# Patient Record
Sex: Male | Born: 1940 | Race: Black or African American | Hispanic: No | Marital: Married | Smoking: Former smoker
Health system: Southern US, Community
[De-identification: ages and names within clinical notes are randomized; demographics above are authoritative.]

## PROBLEM LIST (undated history)

## (undated) DIAGNOSIS — I10 Essential (primary) hypertension: Secondary | ICD-10-CM

## (undated) DIAGNOSIS — E785 Hyperlipidemia, unspecified: Secondary | ICD-10-CM

## (undated) DIAGNOSIS — D869 Sarcoidosis, unspecified: Secondary | ICD-10-CM

## (undated) DIAGNOSIS — Z9289 Personal history of other medical treatment: Secondary | ICD-10-CM

## (undated) DIAGNOSIS — N400 Enlarged prostate without lower urinary tract symptoms: Secondary | ICD-10-CM

## (undated) DIAGNOSIS — J309 Allergic rhinitis, unspecified: Secondary | ICD-10-CM

## (undated) DIAGNOSIS — G4733 Obstructive sleep apnea (adult) (pediatric): Secondary | ICD-10-CM

## (undated) HISTORY — DX: Obstructive sleep apnea (adult) (pediatric): G47.33

## (undated) HISTORY — DX: Sarcoidosis, unspecified: D86.9

## (undated) HISTORY — DX: Allergic rhinitis, unspecified: J30.9

## (undated) HISTORY — DX: Essential (primary) hypertension: I10

## (undated) HISTORY — DX: Personal history of other medical treatment: Z92.89

## (undated) HISTORY — DX: Hyperlipidemia, unspecified: E78.5

## (undated) HISTORY — DX: Benign prostatic hyperplasia without lower urinary tract symptoms: N40.0

---

## 2015-02-15 ENCOUNTER — Ambulatory Visit (INDEPENDENT_AMBULATORY_CARE_PROVIDER_SITE_OTHER): Payer: Medicare Other | Admitting: Critical Care Medicine

## 2015-02-15 ENCOUNTER — Encounter: Payer: Self-pay | Admitting: *Deleted

## 2015-02-15 VITALS — BP 152/80 | HR 60 | Temp 96.3°F | Ht 68.0 in | Wt 185.4 lb

## 2015-02-15 DIAGNOSIS — I10 Essential (primary) hypertension: Secondary | ICD-10-CM

## 2015-02-15 DIAGNOSIS — N400 Enlarged prostate without lower urinary tract symptoms: Secondary | ICD-10-CM

## 2015-02-15 DIAGNOSIS — Z9289 Personal history of other medical treatment: Secondary | ICD-10-CM | POA: Insufficient documentation

## 2015-02-15 DIAGNOSIS — G4733 Obstructive sleep apnea (adult) (pediatric): Secondary | ICD-10-CM

## 2015-02-15 DIAGNOSIS — E785 Hyperlipidemia, unspecified: Secondary | ICD-10-CM

## 2015-02-15 DIAGNOSIS — J309 Allergic rhinitis, unspecified: Secondary | ICD-10-CM

## 2015-02-15 DIAGNOSIS — R0600 Dyspnea, unspecified: Secondary | ICD-10-CM | POA: Insufficient documentation

## 2015-02-15 DIAGNOSIS — D869 Sarcoidosis, unspecified: Secondary | ICD-10-CM

## 2015-02-15 NOTE — Patient Instructions (Signed)
Labs today: ACE level We will [facility] to [facility] will be obtained  An overnight sleep oxygen test on Room air on cpap will be obtained No medication changes for now  Return 6 weeks

## 2015-02-15 NOTE — Assessment & Plan Note (Signed)
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

## 2015-02-15 NOTE — Assessment & Plan Note (Signed)
Dyspnea on exertion. No desat with exertion. No physical exam findings c/w active sarcoidosis. [facility] 2014 NAD. Need updated imaging  Plan CT Chest Get pft results  ACE level

## 2015-02-15 NOTE — Progress Notes (Signed)
Subjective:    Patient ID: Alex Roberts, male    DOB: Feb 12, 1941, 74 y.o.   MRN: [id number]  HPI Comments: Hx of sarcoidosis since 1970.  Just in lungs and also rash in upper arms. Pt had mediastinoscopy and skin bx and pos for granulomas: [facility]. Pt Rx :  No steroids Rx and had cleared on its own. Pt noted off and on no real issues, except some mild dyspnea.  Now more dyspnea with exertion not at rest, not on oxygen . Dx osa and on cpap qhs , full face mask.  [employer] is [employer] [place]- 1979  [place] Underground, 7890ft from [place], pumps equip with high nose.   Shortness of Breath This is a chronic problem. The current episode started more than 1 year ago. The problem occurs daily (more DOE x last few years, ok at rest. Exercise is a challenge, after riding bike or yard work , no qhs dyspnea ). The problem has been gradually worsening. Associated symptoms include leg pain and wheezing. Pertinent negatives include no abdominal pain, chest pain, fever, headaches, hemoptysis, leg swelling, neck pain, orthopnea, PND, rash, rhinorrhea, sore throat, sputum production, swollen glands, syncope or vomiting. The symptoms are aggravated by fumes, odors, pollens, URIs, occupational exposure, exercise, any activity and emotional upset. Associated symptoms comments: No real cough Hearing loss, hx of nuclear weapons, svc connected but denied claim More joint pain . Risk factors include smoking (smoked 2659yrs and quit in 1993). He has tried nothing for the symptoms. His past medical history is significant for chronic lung disease. There is no history of allergies, aspirin allergies, asthma, bronchiolitis, CAD, COPD, DVT, a heart failure, PE or pneumonia.    Past Medical History  Diagnosis Date  . Hyperlipemia   . OSA (obstructive sleep apnea)   . Benign essential hypertension   . 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 Allergic  rhinitis   . Sarcoidosis   . History of positive PPD      Family History  Problem Relation Age of Onset  . Prostate cancer Brother   . Bone cancer Father   . Arthritis Sister   . Heart attack Brother     died of MI at age 73     History   Social History  . Marital Status: Married    Spouse Name: N/A  . Number of Children: N/A  . Years of Education: N/A   Occupational History  . Not on file.   Social History Main Topics  . Smoking status: Former Smoker -- 0.50 packs/day for 30 years    Types: Cigarettes    Quit date: 02/15/1992  . Smokeless tobacco: Never Used  . Alcohol Use: 0.6 oz/week    1 Standard drinks or equivalent per week  . Drug Use: Not on file  . Sexual Activity: Not on file   Other Topics Concern  . Not on file   Social History Narrative   Married. 3 Children.   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   Retired from [employer].     No Known Allergies   No outpatient prescriptions prior to visit.   No facility-administered medications prior to visit.      Review of Systems  Constitutional: Negative for fever and fatigue.  HENT: Positive for trouble swallowing. Negative for rhinorrhea, sore throat and voice change.  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 Negative for cough, hemoptysis, sputum production, chest tightness and stridor.   Cardiovascular: Negative for chest pain, orthopnea, leg swelling, syncope and PND.  Gastrointestinal: Negative for vomiting and abdominal pain.       GERD  Musculoskeletal: Negative for neck pain.  Skin: Negative for rash.  Neurological: Negative for headaches.       Objective:   Physical Exam Filed Vitals:   02/15/15 0958  BP: 152/80  Pulse: 60  Temp: 96.3 F (35.7 C)  TempSrc: Oral  Height: 5\' 8"  (1.727 m)  SpO2: 97%    Gen: Pleasant, well-nourished,  in no distress,  normal affect  ENT: No lesions,  mouth clear,  oropharynx clear, no postnasal drip  Neck: No JVD, no TMG, no carotid bruits  Lungs: No use of accessory muscles, no dullness to percussion, clear without rales or rhonchi  Cardiovascular: RRR, heart sounds normal, no murmur or gallops, no peripheral edema  Abdomen: soft and NT, no HSM,  BS normal  Musculoskeletal: No deformities, no cyanosis or clubbing  Neuro: alert, non focal  Skin: Warm, no lesions or rashes  No results found.  2013 CXR: NAD [facility]: requested CMET, CBC, Ser Calcium and LFTs 2015: NORMAL Amb sat on RA 02/15/2015: 3Laps HR to 86 Sats down to 90%        Assessment & Plan:  Dyspnea Dyspnea on exertion. No desat with exertion. No physical exam findings c/w active sarcoidosis. [facility] CX[facility] 2014 NAD. 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

## 2015-02-22 ENCOUNTER — Institutional Professional Consult (permissible substitution): Payer: Self-pay | Admitting: Critical Care Medicine

## 2015-02-28 ENCOUNTER — Telehealth: Payer: Self-pay | Admitting: Critical Care Medicine

## 2015-02-28 DIAGNOSIS — D869 Sarcoidosis, unspecified: Secondary | ICD-10-CM

## 2015-02-28 NOTE — Telephone Encounter (Signed)
Let pt know CT Chest was normal and labs were normal NO ACTIVE sarcoidosis seen No change in medications

## 2015-02-28 NOTE — Telephone Encounter (Signed)
Spoke with pt, he is aware of results and recs.  Nothing further needed. 

## 2015-02-28 NOTE — Telephone Encounter (Signed)
Pt returning call.Stanley A Dalton ° °

## 2015-02-28 NOTE — Telephone Encounter (Signed)
lmomtcb for pt 

## 2015-03-04 ENCOUNTER — Telehealth: Payer: Self-pay | Admitting: Critical Care Medicine

## 2015-03-04 DIAGNOSIS — G4733 Obstructive sleep apnea (adult) (pediatric): Secondary | ICD-10-CM

## 2015-03-04 NOTE — Telephone Encounter (Signed)
Called, spoke with pt.  Discussed ONO results and recs per Dr. Delford FieldWright.  He verbalized understanding and voiced no further questions or concerns at this time.

## 2015-03-04 NOTE — Telephone Encounter (Signed)
Let pt know ono on cpap ra NORMAL  No oxygen needed

## 2015-03-21 ENCOUNTER — Encounter: Payer: Self-pay | Admitting: Critical Care Medicine

## 2015-03-29 ENCOUNTER — Encounter: Payer: Self-pay | Admitting: Critical Care Medicine

## 2015-03-29 ENCOUNTER — Ambulatory Visit (INDEPENDENT_AMBULATORY_CARE_PROVIDER_SITE_OTHER): Payer: Medicare Other | Admitting: Critical Care Medicine

## 2015-03-29 VITALS — BP 136/78 | HR 68 | Temp 96.4°F | Ht 68.0 in | Wt 185.4 lb

## 2015-03-29 DIAGNOSIS — D869 Sarcoidosis, unspecified: Secondary | ICD-10-CM

## 2015-03-29 DIAGNOSIS — R0600 Dyspnea, unspecified: Secondary | ICD-10-CM

## 2015-03-29 NOTE — Patient Instructions (Signed)
No change in medications. Return as needed 

## 2015-03-29 NOTE — Progress Notes (Signed)
Subjective:    Patient ID: Alex Roberts, male    DOB: 16-Dec-1940, 74 y.o.   MRN: [id number]  HPI 03/29/2015 Chief Complaint  Patient presents with  . Follow-up    Sob staying the same,no cough. Denies wheezing,cp or tightness. Still using CPAP-no problems.  No new issues. No real cough. No wheezing. ON Cpap.  Pt denies any significant sore throat, nasal congestion or excess secretions, fever, chills, sweats, unintended weight loss, pleurtic or exertional chest pain, orthopnea PND, or leg swelling Pt denies any increase in rescue therapy over baseline, denies waking up needing it or having any early am or nocturnal exacerbations of coughing/wheezing/or dyspnea. Pt also denies any obvious fluctuation in symptoms with  weather or environmental change or other alleviating or aggravating factors   Current Medications, Allergies, Complete Past Medical History, Past Surgical History, Family History, and Social History were reviewed in [employer] [facility] electronic medical record per todays encounter:  03/29/2015  Review of Systems  Constitutional: Negative.   HENT: Negative.  Negative for ear pain, postnasal drip, rhinorrhea, sinus pressure, sore throat, trouble swallowing and voice change.   Eyes: Negative.   Respiratory: Positive for shortness of breath. Negative for apnea, cough, choking, chest tightness, wheezing and stridor.   Cardiovascular: Negative.  Negative for chest pain, palpitations and leg swelling.  Gastrointestinal: Negative.  Negative for nausea, vomiting, abdominal pain and abdominal distention.  Genitourinary: Negative.   Musculoskeletal: Negative.  Negative for myalgias and arthralgias.  Skin: Negative.  Negative for rash.  Allergic/Immunologic: Negative.  Negative for environmental allergies and food allergies.  Neurological: Negative.  Negative for dizziness, syncope, weakness and headaches.  Hematological: Negative.  Negative for adenopathy. Does not bruise/bleed easily.    Psychiatric/Behavioral: Negative.  Negative for sleep disturbance and agitation. The patient is not nervous/anxious.        Objective:   Physical Exam  Filed Vitals:   03/29/15 1044  BP: 136/78  Pulse: 68  Temp: 96.4 F (35.8 C)  TempSrc: Oral  Height: 5\' 8"  (1.727 m)  Weight: 185 lb 6.4 oz (84.097 kg)  SpO2: 97%    Gen: Pleasant, well-nourished, in no distress,  normal affect  ENT: No lesions,  mouth clear,  oropharynx clear, no postnasal drip  Neck: No JVD, no TMG, no carotid bruits  Lungs: No use of accessory muscles, no dullness to percussion, clear without rales or rhonchi  Cardiovascular: RRR, heart sounds normal, no murmur or gallops, no peripheral edema  Abdomen: soft and NT, no HSM,  BS normal  Musculoskeletal: No deformities, no cyanosis or clubbing  Neuro: alert, non focal  Skin: Warm, no lesions or rashes  No results found.  Lung function test from the [place] system were read out as normal on the waiting the exact numbers at this time for review CT of the chest May 2016 showed no active sarcoidosis  ACE level was normal Overnight sleep oximetry on room air with C Pap was normal    Assessment & Plan:  I personally reviewed all images and lab data in the [facility] system as well as any outside material available during this office visit and agree with the  radiology impressions.   Sarcoidosis History of sarcoidosis but no evidence of active sarcoidosis at this time. Specifically they CT scan of the chest is normal from May 2016 in angiotensin converting enzyme levels were normal as well. Note recent pulmonary function studies from the [place] system were normal as well.  Plan No specific therapy is indicated  as the patient does not have active sarcoidosis at this time Return as needed  Dyspnea History of  dyspnea on exertion however there is no desaturation with exercise normal lung function studies and normal CT scan of chest and ACE level No further pulmonary  evaluation indicated   [name] was seen today for follow-up.  Diagnoses and all orders for this visit:  Sarcoidosis  Dyspnea    I had an extended discussion with the patient and or family lasting 10 minutes of a 25 minute visit including:  The fact the patient does not have active sarcoidosis and I reviewed patient's current studies

## 2015-03-29 NOTE — Assessment & Plan Note (Signed)
History of  dyspnea on exertion however there is no desaturation with exercise normal lung function studies and normal CT scan of chest and ACE level No further pulmonary evaluation indicated

## 2015-03-29 NOTE — Assessment & Plan Note (Signed)
History of sarcoidosis but no evidence of active sarcoidosis at this time. Specifically they CT scan of the chest is normal from May 2016 in angiotensin converting enzyme levels were normal as well. Note recent pulmonary function studies from the [place] system were normal as well.  Plan No specific therapy is indicated as the patient does not have active sarcoidosis at this time Return as needed

## 2019-12-23 DIAGNOSIS — L309 Dermatitis, unspecified: Secondary | ICD-10-CM | POA: Diagnosis not present

## 2019-12-23 DIAGNOSIS — L290 Pruritus ani: Secondary | ICD-10-CM | POA: Diagnosis not present

## 2019-12-23 DIAGNOSIS — Z6827 Body mass index (BMI) 27.0-27.9, adult: Secondary | ICD-10-CM | POA: Diagnosis not present

## 2020-01-07 DIAGNOSIS — E119 Type 2 diabetes mellitus without complications: Secondary | ICD-10-CM | POA: Diagnosis not present

## 2020-01-07 DIAGNOSIS — H25813 Combined forms of age-related cataract, bilateral: Secondary | ICD-10-CM | POA: Diagnosis not present

## 2020-01-22 DIAGNOSIS — H2511 Age-related nuclear cataract, right eye: Secondary | ICD-10-CM | POA: Diagnosis not present

## 2020-01-22 DIAGNOSIS — H43813 Vitreous degeneration, bilateral: Secondary | ICD-10-CM | POA: Diagnosis not present

## 2020-01-22 DIAGNOSIS — Z01818 Encounter for other preprocedural examination: Secondary | ICD-10-CM | POA: Diagnosis not present

## 2020-01-22 DIAGNOSIS — H25811 Combined forms of age-related cataract, right eye: Secondary | ICD-10-CM | POA: Diagnosis not present

## 2020-01-29 DIAGNOSIS — H25812 Combined forms of age-related cataract, left eye: Secondary | ICD-10-CM | POA: Diagnosis not present

## 2020-01-29 DIAGNOSIS — H25811 Combined forms of age-related cataract, right eye: Secondary | ICD-10-CM | POA: Diagnosis not present

## 2020-02-25 DIAGNOSIS — H8110 Benign paroxysmal vertigo, unspecified ear: Secondary | ICD-10-CM | POA: Diagnosis not present

## 2020-02-25 DIAGNOSIS — Z6826 Body mass index (BMI) 26.0-26.9, adult: Secondary | ICD-10-CM | POA: Diagnosis not present

## 2020-03-03 DIAGNOSIS — K200 Eosinophilic esophagitis: Secondary | ICD-10-CM | POA: Diagnosis not present

## 2020-04-26 DIAGNOSIS — E119 Type 2 diabetes mellitus without complications: Secondary | ICD-10-CM | POA: Diagnosis not present

## 2020-04-26 DIAGNOSIS — H2512 Age-related nuclear cataract, left eye: Secondary | ICD-10-CM | POA: Diagnosis not present

## 2020-05-17 DIAGNOSIS — G4730 Sleep apnea, unspecified: Secondary | ICD-10-CM | POA: Diagnosis not present

## 2020-05-17 DIAGNOSIS — H2512 Age-related nuclear cataract, left eye: Secondary | ICD-10-CM | POA: Diagnosis not present

## 2020-05-17 DIAGNOSIS — Z7982 Long term (current) use of aspirin: Secondary | ICD-10-CM | POA: Diagnosis not present

## 2020-05-17 DIAGNOSIS — Z7984 Long term (current) use of oral hypoglycemic drugs: Secondary | ICD-10-CM | POA: Diagnosis not present

## 2020-05-17 DIAGNOSIS — J449 Chronic obstructive pulmonary disease, unspecified: Secondary | ICD-10-CM | POA: Diagnosis not present

## 2020-05-17 DIAGNOSIS — Z79899 Other long term (current) drug therapy: Secondary | ICD-10-CM | POA: Diagnosis not present

## 2020-05-17 DIAGNOSIS — E7800 Pure hypercholesterolemia, unspecified: Secondary | ICD-10-CM | POA: Diagnosis not present

## 2020-05-17 DIAGNOSIS — H259 Unspecified age-related cataract: Secondary | ICD-10-CM | POA: Diagnosis not present

## 2020-05-17 DIAGNOSIS — I10 Essential (primary) hypertension: Secondary | ICD-10-CM | POA: Diagnosis not present

## 2020-05-17 DIAGNOSIS — E1136 Type 2 diabetes mellitus with diabetic cataract: Secondary | ICD-10-CM | POA: Diagnosis not present

## 2020-05-28 DIAGNOSIS — H25812 Combined forms of age-related cataract, left eye: Secondary | ICD-10-CM | POA: Diagnosis not present

## 2020-06-10 DIAGNOSIS — Z6826 Body mass index (BMI) 26.0-26.9, adult: Secondary | ICD-10-CM | POA: Diagnosis not present

## 2020-06-10 DIAGNOSIS — Z9181 History of falling: Secondary | ICD-10-CM | POA: Diagnosis not present

## 2020-06-10 DIAGNOSIS — R1084 Generalized abdominal pain: Secondary | ICD-10-CM | POA: Diagnosis not present

## 2020-06-24 DIAGNOSIS — Z6825 Body mass index (BMI) 25.0-25.9, adult: Secondary | ICD-10-CM | POA: Diagnosis not present

## 2020-06-24 DIAGNOSIS — D649 Anemia, unspecified: Secondary | ICD-10-CM | POA: Diagnosis not present

## 2020-06-24 DIAGNOSIS — E7800 Pure hypercholesterolemia, unspecified: Secondary | ICD-10-CM | POA: Diagnosis not present

## 2020-06-24 DIAGNOSIS — E1165 Type 2 diabetes mellitus with hyperglycemia: Secondary | ICD-10-CM | POA: Diagnosis not present

## 2020-06-24 DIAGNOSIS — Z79899 Other long term (current) drug therapy: Secondary | ICD-10-CM | POA: Diagnosis not present

## 2020-07-08 DIAGNOSIS — L731 Pseudofolliculitis barbae: Secondary | ICD-10-CM | POA: Diagnosis not present

## 2020-08-08 DIAGNOSIS — Z20828 Contact with and (suspected) exposure to other viral communicable diseases: Secondary | ICD-10-CM | POA: Diagnosis not present

## 2020-08-08 DIAGNOSIS — R070 Pain in throat: Secondary | ICD-10-CM | POA: Diagnosis not present

## 2020-08-23 DIAGNOSIS — R1013 Epigastric pain: Secondary | ICD-10-CM | POA: Diagnosis not present

## 2020-08-24 DIAGNOSIS — I129 Hypertensive chronic kidney disease with stage 1 through stage 4 chronic kidney disease, or unspecified chronic kidney disease: Secondary | ICD-10-CM | POA: Diagnosis not present

## 2020-08-24 DIAGNOSIS — Z0000 Encounter for general adult medical examination without abnormal findings: Secondary | ICD-10-CM | POA: Diagnosis not present

## 2020-08-24 DIAGNOSIS — L731 Pseudofolliculitis barbae: Secondary | ICD-10-CM | POA: Diagnosis not present

## 2020-08-24 DIAGNOSIS — E1122 Type 2 diabetes mellitus with diabetic chronic kidney disease: Secondary | ICD-10-CM | POA: Diagnosis not present

## 2020-08-24 DIAGNOSIS — Z6825 Body mass index (BMI) 25.0-25.9, adult: Secondary | ICD-10-CM | POA: Diagnosis not present

## 2020-08-26 DIAGNOSIS — R1013 Epigastric pain: Secondary | ICD-10-CM | POA: Diagnosis not present

## 2020-08-26 DIAGNOSIS — R109 Unspecified abdominal pain: Secondary | ICD-10-CM | POA: Diagnosis not present

## 2020-08-26 DIAGNOSIS — D649 Anemia, unspecified: Secondary | ICD-10-CM | POA: Diagnosis not present

## 2020-08-29 DIAGNOSIS — Z791 Long term (current) use of non-steroidal anti-inflammatories (NSAID): Secondary | ICD-10-CM | POA: Diagnosis not present

## 2020-08-29 DIAGNOSIS — R1013 Epigastric pain: Secondary | ICD-10-CM | POA: Diagnosis not present

## 2020-08-29 DIAGNOSIS — K259 Gastric ulcer, unspecified as acute or chronic, without hemorrhage or perforation: Secondary | ICD-10-CM | POA: Diagnosis not present

## 2020-09-16 DIAGNOSIS — R195 Other fecal abnormalities: Secondary | ICD-10-CM | POA: Diagnosis not present

## 2020-10-20 DIAGNOSIS — R195 Other fecal abnormalities: Secondary | ICD-10-CM | POA: Diagnosis not present

## 2020-10-20 DIAGNOSIS — D649 Anemia, unspecified: Secondary | ICD-10-CM | POA: Diagnosis not present

## 2020-10-20 DIAGNOSIS — Z1212 Encounter for screening for malignant neoplasm of rectum: Secondary | ICD-10-CM | POA: Diagnosis not present

## 2020-11-24 DIAGNOSIS — Z1331 Encounter for screening for depression: Secondary | ICD-10-CM | POA: Diagnosis not present

## 2020-11-24 DIAGNOSIS — Z9181 History of falling: Secondary | ICD-10-CM | POA: Diagnosis not present

## 2020-11-24 DIAGNOSIS — E1129 Type 2 diabetes mellitus with other diabetic kidney complication: Secondary | ICD-10-CM | POA: Diagnosis not present

## 2020-11-24 DIAGNOSIS — D649 Anemia, unspecified: Secondary | ICD-10-CM | POA: Diagnosis not present

## 2020-11-24 DIAGNOSIS — E1149 Type 2 diabetes mellitus with other diabetic neurological complication: Secondary | ICD-10-CM | POA: Diagnosis not present

## 2020-11-24 DIAGNOSIS — Z6826 Body mass index (BMI) 26.0-26.9, adult: Secondary | ICD-10-CM | POA: Diagnosis not present

## 2020-11-24 DIAGNOSIS — N183 Chronic kidney disease, stage 3 unspecified: Secondary | ICD-10-CM | POA: Diagnosis not present

## 2020-11-24 DIAGNOSIS — L738 Other specified follicular disorders: Secondary | ICD-10-CM | POA: Diagnosis not present

## 2020-11-29 DIAGNOSIS — D649 Anemia, unspecified: Secondary | ICD-10-CM | POA: Diagnosis not present

## 2020-11-29 DIAGNOSIS — R1013 Epigastric pain: Secondary | ICD-10-CM | POA: Diagnosis not present

## 2020-11-29 DIAGNOSIS — R195 Other fecal abnormalities: Secondary | ICD-10-CM | POA: Diagnosis not present

## 2020-11-29 DIAGNOSIS — K219 Gastro-esophageal reflux disease without esophagitis: Secondary | ICD-10-CM | POA: Diagnosis not present

## 2021-02-21 DIAGNOSIS — D649 Anemia, unspecified: Secondary | ICD-10-CM | POA: Diagnosis not present

## 2021-02-28 DIAGNOSIS — R195 Other fecal abnormalities: Secondary | ICD-10-CM | POA: Diagnosis not present

## 2021-03-13 DIAGNOSIS — L309 Dermatitis, unspecified: Secondary | ICD-10-CM | POA: Diagnosis not present

## 2021-03-13 DIAGNOSIS — Z125 Encounter for screening for malignant neoplasm of prostate: Secondary | ICD-10-CM | POA: Diagnosis not present

## 2021-03-13 DIAGNOSIS — N183 Chronic kidney disease, stage 3 unspecified: Secondary | ICD-10-CM | POA: Diagnosis not present

## 2021-03-13 DIAGNOSIS — L738 Other specified follicular disorders: Secondary | ICD-10-CM | POA: Diagnosis not present

## 2021-03-13 DIAGNOSIS — E1165 Type 2 diabetes mellitus with hyperglycemia: Secondary | ICD-10-CM | POA: Diagnosis not present

## 2021-03-13 DIAGNOSIS — Z79899 Other long term (current) drug therapy: Secondary | ICD-10-CM | POA: Diagnosis not present

## 2021-03-13 DIAGNOSIS — Z6825 Body mass index (BMI) 25.0-25.9, adult: Secondary | ICD-10-CM | POA: Diagnosis not present

## 2021-03-20 DIAGNOSIS — L700 Acne vulgaris: Secondary | ICD-10-CM | POA: Diagnosis not present

## 2021-03-20 DIAGNOSIS — L300 Nummular dermatitis: Secondary | ICD-10-CM | POA: Diagnosis not present

## 2021-03-20 DIAGNOSIS — L810 Postinflammatory hyperpigmentation: Secondary | ICD-10-CM | POA: Diagnosis not present

## 2021-04-17 DIAGNOSIS — N183 Chronic kidney disease, stage 3 unspecified: Secondary | ICD-10-CM | POA: Diagnosis not present

## 2021-04-25 DIAGNOSIS — Z23 Encounter for immunization: Secondary | ICD-10-CM | POA: Diagnosis not present

## 2021-04-28 DIAGNOSIS — N183 Chronic kidney disease, stage 3 unspecified: Secondary | ICD-10-CM | POA: Diagnosis not present

## 2021-04-28 DIAGNOSIS — E1165 Type 2 diabetes mellitus with hyperglycemia: Secondary | ICD-10-CM | POA: Diagnosis not present

## 2021-04-28 DIAGNOSIS — Z6825 Body mass index (BMI) 25.0-25.9, adult: Secondary | ICD-10-CM | POA: Diagnosis not present

## 2021-05-03 DIAGNOSIS — L710 Perioral dermatitis: Secondary | ICD-10-CM | POA: Diagnosis not present

## 2021-06-01 DIAGNOSIS — R195 Other fecal abnormalities: Secondary | ICD-10-CM | POA: Diagnosis not present

## 2021-06-06 DIAGNOSIS — D649 Anemia, unspecified: Secondary | ICD-10-CM | POA: Diagnosis not present

## 2021-06-06 DIAGNOSIS — R109 Unspecified abdominal pain: Secondary | ICD-10-CM | POA: Diagnosis not present

## 2021-06-06 DIAGNOSIS — R195 Other fecal abnormalities: Secondary | ICD-10-CM | POA: Diagnosis not present

## 2021-07-13 DIAGNOSIS — M79672 Pain in left foot: Secondary | ICD-10-CM | POA: Diagnosis not present

## 2021-07-13 DIAGNOSIS — H8110 Benign paroxysmal vertigo, unspecified ear: Secondary | ICD-10-CM | POA: Diagnosis not present

## 2021-07-13 DIAGNOSIS — Z6825 Body mass index (BMI) 25.0-25.9, adult: Secondary | ICD-10-CM | POA: Diagnosis not present

## 2021-07-31 DIAGNOSIS — L710 Perioral dermatitis: Secondary | ICD-10-CM | POA: Diagnosis not present

## 2021-08-09 DIAGNOSIS — D649 Anemia, unspecified: Secondary | ICD-10-CM | POA: Diagnosis not present

## 2021-08-22 DIAGNOSIS — Z23 Encounter for immunization: Secondary | ICD-10-CM | POA: Diagnosis not present

## 2021-10-05 DIAGNOSIS — R109 Unspecified abdominal pain: Secondary | ICD-10-CM | POA: Diagnosis not present

## 2021-10-05 DIAGNOSIS — K5900 Constipation, unspecified: Secondary | ICD-10-CM | POA: Diagnosis not present

## 2021-10-19 DIAGNOSIS — N183 Chronic kidney disease, stage 3 unspecified: Secondary | ICD-10-CM | POA: Diagnosis not present

## 2021-10-19 DIAGNOSIS — Z79899 Other long term (current) drug therapy: Secondary | ICD-10-CM | POA: Diagnosis not present

## 2021-10-19 DIAGNOSIS — I10 Essential (primary) hypertension: Secondary | ICD-10-CM | POA: Diagnosis not present

## 2021-10-19 DIAGNOSIS — E7800 Pure hypercholesterolemia, unspecified: Secondary | ICD-10-CM | POA: Diagnosis not present

## 2021-10-19 DIAGNOSIS — N481 Balanitis: Secondary | ICD-10-CM | POA: Diagnosis not present

## 2021-10-19 DIAGNOSIS — Z6825 Body mass index (BMI) 25.0-25.9, adult: Secondary | ICD-10-CM | POA: Diagnosis not present

## 2021-10-19 DIAGNOSIS — E1122 Type 2 diabetes mellitus with diabetic chronic kidney disease: Secondary | ICD-10-CM | POA: Diagnosis not present

## 2021-10-19 DIAGNOSIS — Z0000 Encounter for general adult medical examination without abnormal findings: Secondary | ICD-10-CM | POA: Diagnosis not present

## 2021-12-11 DIAGNOSIS — Z6826 Body mass index (BMI) 26.0-26.9, adult: Secondary | ICD-10-CM | POA: Diagnosis not present

## 2021-12-11 DIAGNOSIS — H04129 Dry eye syndrome of unspecified lacrimal gland: Secondary | ICD-10-CM | POA: Diagnosis not present

## 2021-12-11 DIAGNOSIS — Z862 Personal history of diseases of the blood and blood-forming organs and certain disorders involving the immune mechanism: Secondary | ICD-10-CM | POA: Diagnosis not present

## 2021-12-11 DIAGNOSIS — H6690 Otitis media, unspecified, unspecified ear: Secondary | ICD-10-CM | POA: Diagnosis not present

## 2021-12-26 DIAGNOSIS — Z961 Presence of intraocular lens: Secondary | ICD-10-CM | POA: Diagnosis not present

## 2021-12-26 DIAGNOSIS — E119 Type 2 diabetes mellitus without complications: Secondary | ICD-10-CM | POA: Diagnosis not present

## 2022-01-18 ENCOUNTER — Ambulatory Visit (INDEPENDENT_AMBULATORY_CARE_PROVIDER_SITE_OTHER): Payer: Medicare PPO

## 2022-01-18 ENCOUNTER — Encounter: Payer: Self-pay | Admitting: Pulmonary Disease

## 2022-01-18 ENCOUNTER — Ambulatory Visit (INDEPENDENT_AMBULATORY_CARE_PROVIDER_SITE_OTHER): Payer: Medicare PPO | Admitting: Pulmonary Disease

## 2022-01-18 VITALS — BP 110/64 | HR 87 | Temp 97.8°F | Ht 68.0 in | Wt 179.0 lb

## 2022-01-18 DIAGNOSIS — R0609 Other forms of dyspnea: Secondary | ICD-10-CM

## 2022-01-18 DIAGNOSIS — R0600 Dyspnea, unspecified: Secondary | ICD-10-CM | POA: Diagnosis not present

## 2022-01-18 NOTE — Progress Notes (Signed)
? ?      ?Alex Roberts    [id number]    [birth year] ? ?Primary Care Physician:Khan, Jaber A, MD ? ?Referring Physician: Lise Auer, MD ?[address] ?[place],  [place] [address] ? ?Chief complaint:   ?Shortness of breath on exertion ? ?HPI: ? ?Longstanding shortness of breath ? ?Past history of sarcoidosis diagnosed about 1970 ?Had a mediastinoscopy ? ?Does not recollect having any medications at the time for the treatment ? ?He was seen by Dr. Delford Field in 2016 ?-Documentation at the time did reveal abnormal pulmonary function test, normal CT scan of the chest with no evidence of mediastinal adenopathy or scarring in the lung. ? ?He reports shortness of breath on exertion sometimes with minimal activity ?Cannot walk 100 yards  ? ?he does have a history of diabetes with some pain in his extremities ? ?No specific pain in his hip or back ? ?He was in the service ? ?No chronic cough, no chest pain or discomfort, no leg swelling ? ?Reformed smoker-15-pack-year smoking history ? ?He does have a history of obstructive sleep apnea, followed by [facility] ?-He tries to be compliant with the CPAP ? ?Outpatient Encounter Medications as of 01/18/2022  ?Medication Sig  ? aspirin 81 MG tablet Take 81 mg by mouth daily.  ? carboxymethylcellulose (REFRESH PLUS) 0.5 % SOLN 1 drop 3 (three) times daily as needed.  ? dutasteride (AVODART) 0.5 MG capsule 0.5 mg. daily  ? glimepiride (AMARYL) 1 MG tablet Take 1 mg by mouth.  ? JARDIANCE 25 MG TABS tablet Take 25 mg by mouth daily.  ? olmesartan-hydrochlorothiazide (BENICAR HCT) 40-12.5 MG tablet Take 1 tablet by mouth daily.  ? pantoprazole (PROTONIX) 40 MG tablet Take 1 tablet by mouth.  ? simvastatin (ZOCOR) 10 MG tablet Take 10 mg by mouth daily.  ? [DISCONTINUED] valsartan (DIOVAN) 320 MG tablet Take 320 mg by mouth daily. for high blood pressure  ? ?No facility-administered encounter medications on file as of 01/18/2022.  ? ? ?Allergies as of 01/18/2022  ? (No Known  Allergies)  ? ? ?Past Medical History:  ?Diagnosis Date  ? Allergic rhinitis   ? Benign essential hypertension   ? 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 History of positive PPD   ? Hyperlipemia   ? OSA (obstructive sleep apnea)   ? Sarcoidosis   ? ? ? ?Family History  ?Problem Relation Age of Onset  ? Bone cancer Father   ? Arthritis Sister   ? Prostate cancer Brother   ? Heart attack Brother   ?     died of MI at age 69  ? ? ?Social History  ? ?Socioeconomic History  ? Marital status: Married  ?  Spouse name: Not on file  ? Number of children: Not on file  ? Years of education: Not on file  ? Highest education level: Not on file  ?Occupational History  ? Not on file  ?Tobacco Use  ? Smoking status: Former  ?  Packs/day: 0.50  ?  Years: 30.00  ?  Pack years: 15.00  ?  Types: Cigarettes  ?  Quit date: 02/15/1992  ?  Years since quitting: 29.9  ? Smokeless tobacco: Never  ?Substance and Sexual Activity  ? Alcohol use: Yes  ?  Alcohol/week: 1.0 standard drink  ?  Types: 1 Standard drinks or equivalent per week  ? Drug use: Never  ? Sexual activity: Not Currently  ?Other Topics Concern  ?  Not on file  ?Social History Narrative  ? Married. 3 Children.  ? 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  ? 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  ? Retired from [employer].  ? ?Social Determinants of Health  ? ?Financial Resource Strain: Not on file  ?Food Insecurity: Not on file  ?Transportation Needs: Not on file  ?Physical Activity: Not on file  ?Stress: Not on file  ?Social Connections: Not on file  ?Intimate Partner Violence: Not on file  ? ? ?Review of Systems  ?Respiratory:  Positive for apnea and shortness of breath.   ?Psychiatric/Behavioral:  Positive for sleep disturbance.   ? ?Vitals:  ? 01/18/22 1045  ?BP: 110/64  ?Pulse: 87  ?Temp: 97.8 ?F (36.6 ?C)  ? ? ? ?Physical Exam ?Constitutional:   ?   Appearance: Normal appearance.  ?HENT:  ?   Head: Normocephalic.  ?   Mouth/Throat:   ?   Mouth: Mucous membranes are moist.  ?Eyes:  ?   Pupils: Pupils are equal, round, and reactive to light.  ?Cardiovascular:  ?   Rate and Rhythm: Normal rate and regular rhythm.  ?   Heart sounds: No murmur heard. ?  No friction rub.  ?Pulmonary:  ?   Effort: No respiratory distress.  ?   Breath sounds: No stridor. No wheezing or rhonchi.  ?Musculoskeletal:  ?   Cervical back: No rigidity.  ?Neurological:  ?   Mental Status: He is alert.  ?Psychiatric:     ?   Mood and Affect: Mood normal.  ? ? ?Data Reviewed: ?Records by Dr. Delford Field from 03/29/2015 reviewed showing a CT scan with no ongoing evidence of sarcoidosis, no adenopathy, no scarring in the lungs ? ?Pulmonary function test reportedly at that time was negative for significant obstructive disease or restrictive disease ? ?Assessment:  ?Past history of sarcoidosis with most recent CT scan and pulmonary function test within normal limits ? ?Obstructive sleep apnea ?-On CPAP therapy ?-Pressure is unknown ?-Claims good compliance ?-[facility] ? ?Shortness of breath with activity-short of breath with mild to moderate activity ? ?Plan/Recommendations: ?We will obtain a chest x-ray ? ?Schedule patient for pulmonary function test ? ?Schedule echocardiogram for dyspnea on exertion ? ?No indication for any inhalers at present ? ?Encouraged to call with significant concerns ? ?Follow-up in 6 weeks ? ? ?Virl Diamond MD ?[facility] ?01/18/2022, 11:13 AM ? ?CC: Lise Auer, MD ? ? ?

## 2022-01-18 NOTE — Patient Instructions (Signed)
Shortness of breath on exertion ? ?Obtain chest x-ray ?Schedule for pulmonary function tests ?Schedule echocardiogram for dyspnea on exertion ? ?I will see you back in about 6 weeks ? ?Call with significant concerns ?

## 2022-01-26 ENCOUNTER — Ambulatory Visit (INDEPENDENT_AMBULATORY_CARE_PROVIDER_SITE_OTHER): Payer: Medicare PPO

## 2022-01-26 DIAGNOSIS — R0609 Other forms of dyspnea: Secondary | ICD-10-CM | POA: Diagnosis not present

## 2022-01-26 LAB — ECHOCARDIOGRAM COMPLETE
Area-P 1/2: 2.66 cm2
S' Lateral: 3 cm

## 2022-02-02 ENCOUNTER — Other Ambulatory Visit (HOSPITAL_COMMUNITY): Payer: Medicare PPO

## 2022-02-13 DIAGNOSIS — H9190 Unspecified hearing loss, unspecified ear: Secondary | ICD-10-CM | POA: Diagnosis not present

## 2022-02-13 DIAGNOSIS — H9319 Tinnitus, unspecified ear: Secondary | ICD-10-CM | POA: Diagnosis not present

## 2022-02-13 DIAGNOSIS — H6982 Other specified disorders of Eustachian tube, left ear: Secondary | ICD-10-CM | POA: Diagnosis not present

## 2022-03-02 ENCOUNTER — Ambulatory Visit (INDEPENDENT_AMBULATORY_CARE_PROVIDER_SITE_OTHER): Payer: Medicare PPO | Admitting: Pulmonary Disease

## 2022-03-02 ENCOUNTER — Encounter: Payer: Self-pay | Admitting: Pulmonary Disease

## 2022-03-02 VITALS — BP 116/72 | HR 73 | Ht 68.0 in | Wt 175.2 lb

## 2022-03-02 DIAGNOSIS — R0609 Other forms of dyspnea: Secondary | ICD-10-CM | POA: Diagnosis not present

## 2022-03-02 LAB — PULMONARY FUNCTION TEST
DL/VA % pred: 105 %
DL/VA: 4.14 ml/min/mmHg/L
DLCO cor % pred: 93 %
DLCO cor: 21.52 ml/min/mmHg
DLCO unc % pred: 93 %
DLCO unc: 21.52 ml/min/mmHg
FEF 25-75 Post: 3.47 L/sec
FEF 25-75 Pre: 2.99 L/sec
FEF2575-%Change-Post: 15 %
FEF2575-%Pred-Post: 180 %
FEF2575-%Pred-Pre: 155 %
FEV1-%Change-Post: 9 %
FEV1-%Pred-Post: 115 %
FEV1-%Pred-Pre: 105 %
FEV1-Post: 2.84 L
FEV1-Pre: 2.6 L
FEV1FVC-%Change-Post: -1 %
FEV1FVC-%Pred-Pre: 111 %
FEV6-%Change-Post: 10 %
FEV6-%Pred-Post: 107 %
FEV6-%Pred-Pre: 97 %
FEV6-Post: 3.43 L
FEV6-Pre: 3.09 L
FEV6FVC-%Change-Post: 0 %
FEV6FVC-%Pred-Post: 106 %
FEV6FVC-%Pred-Pre: 106 %
FVC-%Change-Post: 10 %
FVC-%Pred-Post: 102 %
FVC-%Pred-Pre: 92 %
FVC-Post: 3.44 L
FVC-Pre: 3.12 L
Post FEV1/FVC ratio: 82 %
Post FEV6/FVC ratio: 100 %
Pre FEV1/FVC ratio: 83 %
Pre FEV6/FVC Ratio: 100 %
RV % pred: 96 %
RV: 2.44 L
TLC % pred: 97 %
TLC: 6.5 L

## 2022-03-02 MED ORDER — TRELEGY ELLIPTA 100-62.5-25 MCG/ACT IN AEPB: 1.0000 | INHALATION_SPRAY | Freq: Every day | RESPIRATORY_TRACT | 3 refills | Status: AC

## 2022-03-02 MED ORDER — TRELEGY ELLIPTA 100-62.5-25 MCG/ACT IN AEPB: 1.0000 | INHALATION_SPRAY | Freq: Every day | RESPIRATORY_TRACT | 0 refills | Status: AC

## 2022-03-02 NOTE — Progress Notes (Signed)
Full PFT Performed Today  

## 2022-03-02 NOTE — Patient Instructions (Signed)
Trial with prescription of Trelegy 100  Trelegy to be used once a day  Make sure you rinse your mouth after use  Follow-up in 3 months

## 2022-03-02 NOTE — Addendum Note (Signed)
Addended by: Arvilla Market on: 03/02/2022 05:04 PM   Modules accepted: Orders

## 2022-03-02 NOTE — Patient Instructions (Signed)
Full PFT Performed Today  

## 2022-03-02 NOTE — Progress Notes (Signed)
Alex Roberts    [id number]    Nov 14, 1940  Primary Care Physician:Khan, Tamera Reason, MD  Referring Physician: Lise Auer, MD [address] [place],  [place] [address]  Chief complaint:   Shortness of breath on exertion  HPI:  Longstanding shortness of breath  States may be worse than the last time he was here Past history of sarcoidosis diagnosed in 1970 Had mediastinoscopy  Shortness of breath is a little bit more pronounced he states  Since the last visit had a 6 chest x-ray-normal Echocardiogram with normal ejection fraction, no diastolic dysfunction Pulmonary function test shows no obstruction, no significant bronchodilator response, normal diffusing capacity  Past history of sarcoidosis diagnosed about 1970 Had a mediastinoscopy  Does not recollect having any medications at the time for the treatment  He was seen by Dr. Delford Field in 2016 -Documentation at the time did reveal abnormal pulmonary function test, normal CT scan of the chest with no evidence of mediastinal adenopathy or scarring in the lung.  Is still not able to walk over 100 yards  he does have a history of diabetes with some pain in his extremities  No specific pain in his hip or back  He was in the service  No chronic cough, no chest pain or discomfort, no leg swelling  Reformed smoker-15-pack-year smoking history  He does have a history of obstructive sleep apnea, followed by [facility] -He tries to be compliant with the CPAP  Outpatient Encounter Medications as of 03/02/2022  Medication Sig   aspirin 81 MG tablet Take 81 mg by mouth daily.   carboxymethylcellulose (REFRESH PLUS) 0.5 % SOLN 1 drop 3 (three) times daily as needed.   dutasteride (AVODART) 0.5 MG capsule 0.5 mg. daily   fluticasone (FLONASE) 50 MCG/ACT nasal spray Place into both nostrils.   Fluticasone-Umeclidin-Vilant (TRELEGY ELLIPTA) 100-62.5-25 MCG/ACT AEPB Inhale 1 puff into the lungs daily.   glimepiride  (AMARYL) 1 MG tablet Take 1 mg by mouth.   JARDIANCE 25 MG TABS tablet Take 25 mg by mouth daily.   olmesartan-hydrochlorothiazide (BENICAR HCT) 40-12.5 MG tablet Take 1 tablet by mouth daily.   pantoprazole (PROTONIX) 40 MG tablet Take 1 tablet by mouth.   simvastatin (ZOCOR) 10 MG tablet Take 10 mg by mouth daily.   No facility-administered encounter medications on file as of 03/02/2022.    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    History of positive PPD    Hyperlipemia    OSA (obstructive sleep apnea)    Sarcoidosis      Family History  Problem Relation Age of Onset   Bone cancer Father    Arthritis Sister    Prostate cancer Brother    Heart attack Brother        died of MI at age 68    Social History   Socioeconomic History   Marital status: Married    Spouse name: Not on file   Number of children: Not on file   Years of education: Not on file   Highest education level: Not on file  Occupational History   Not on file  Tobacco Use   Smoking status: Former    Packs/day: 0.50    Years: 30.00    Pack years: 15.00  Types: Cigarettes    Quit date: 02/15/1992    Years since quitting: 30.0   Smokeless tobacco: Never  Substance and Sexual Activity   Alcohol use: Yes    Alcohol/week: 1.0 standard drink    Types: 1 Standard drinks or equivalent per week   Drug use: Never   Sexual activity: Not Currently  Other Topics Concern   Not on file  Social History Narrative   Married. 3 Children.   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   Retired from [employer].   Social Determinants of Health   Financial Resource Strain: Not on file  Food Insecurity: Not on file  Transportation Needs: Not on file  Physical Activity: Not on file   Stress: Not on file  Social Connections: Not on file  Intimate Partner Violence: Not on file    Review of Systems  Constitutional:  Positive for fatigue.  Respiratory:  Positive for apnea and shortness of breath.   Psychiatric/Behavioral:  Positive for sleep disturbance.    Vitals:   03/02/22 1337  BP: 116/72  Pulse: 73  SpO2: 92%     Physical Exam Constitutional:      Appearance: Normal appearance.  HENT:     Mouth/Throat:     Mouth: Mucous membranes are moist.  Eyes:     Pupils: Pupils are equal, round, and reactive to light.  Cardiovascular:     Rate and Rhythm: Normal rate and regular rhythm.     Heart sounds: No murmur heard.   No friction rub.  Pulmonary:     Effort: No respiratory distress.     Breath sounds: No stridor. No wheezing or rhonchi.  Musculoskeletal:     Cervical back: No rigidity or tenderness.  Neurological:     Mental Status: He is alert.  Psychiatric:        Mood and Affect: Mood normal.    Data Reviewed: Records by Dr. Delford Field from 03/29/2015 reviewed showing a CT scan with no ongoing evidence of sarcoidosis, no adenopathy, no scarring in the lungs  Pulmonary function test reportedly at that time was negative for significant obstructive disease or restrictive disease  Pulmonary function test shows no obstruction, no restriction, no significant bronchodilator response-03/02/2022  Chest x-ray 01/18/2022 shows no significant pathology  Echocardiogram April 2023 shows normal ejection fraction, no diastolic dysfunction  Assessment:   Patient with past history of sarcoidosis  Obstructive sleep apnea on CPAP therapy -Maintains good compliance  Shortness of breath with activity   Plan/Recommendations: No PFTs within normal limits  Trial with an inhaler may be of benefit  Decided on Trelegy 100 to be used daily  Encouraged to call with significant concerns  Follow-up in 3 months to assess stabilization/improvement in  symptoms  Virl Diamond MD [facility] 03/02/2022, 1:54 PM  CC: Lise Auer, MD

## 2022-03-06 DIAGNOSIS — Z974 Presence of external hearing-aid: Secondary | ICD-10-CM | POA: Diagnosis not present

## 2022-03-06 DIAGNOSIS — H9192 Unspecified hearing loss, left ear: Secondary | ICD-10-CM | POA: Diagnosis not present

## 2022-03-06 DIAGNOSIS — Z8669 Personal history of other diseases of the nervous system and sense organs: Secondary | ICD-10-CM | POA: Diagnosis not present

## 2022-03-06 DIAGNOSIS — H9193 Unspecified hearing loss, bilateral: Secondary | ICD-10-CM | POA: Diagnosis not present

## 2022-06-12 DIAGNOSIS — Z9181 History of falling: Secondary | ICD-10-CM | POA: Diagnosis not present

## 2022-06-12 DIAGNOSIS — Z1331 Encounter for screening for depression: Secondary | ICD-10-CM | POA: Diagnosis not present

## 2022-06-12 DIAGNOSIS — N183 Chronic kidney disease, stage 3 unspecified: Secondary | ICD-10-CM | POA: Diagnosis not present

## 2022-06-12 DIAGNOSIS — I10 Essential (primary) hypertension: Secondary | ICD-10-CM | POA: Diagnosis not present

## 2022-06-12 DIAGNOSIS — E1165 Type 2 diabetes mellitus with hyperglycemia: Secondary | ICD-10-CM | POA: Diagnosis not present

## 2022-06-12 DIAGNOSIS — Z6826 Body mass index (BMI) 26.0-26.9, adult: Secondary | ICD-10-CM | POA: Diagnosis not present

## 2022-06-12 DIAGNOSIS — K589 Irritable bowel syndrome without diarrhea: Secondary | ICD-10-CM | POA: Diagnosis not present

## 2022-06-26 ENCOUNTER — Ambulatory Visit: Payer: Medicare PPO | Admitting: Pulmonary Disease

## 2022-06-28 DIAGNOSIS — E7800 Pure hypercholesterolemia, unspecified: Secondary | ICD-10-CM | POA: Diagnosis not present

## 2022-06-28 DIAGNOSIS — N189 Chronic kidney disease, unspecified: Secondary | ICD-10-CM | POA: Diagnosis not present

## 2022-06-28 DIAGNOSIS — Z79899 Other long term (current) drug therapy: Secondary | ICD-10-CM | POA: Diagnosis not present

## 2022-06-28 DIAGNOSIS — R109 Unspecified abdominal pain: Secondary | ICD-10-CM | POA: Diagnosis not present

## 2022-06-28 DIAGNOSIS — J439 Emphysema, unspecified: Secondary | ICD-10-CM | POA: Diagnosis not present

## 2022-06-28 DIAGNOSIS — E1122 Type 2 diabetes mellitus with diabetic chronic kidney disease: Secondary | ICD-10-CM | POA: Diagnosis not present

## 2022-06-28 DIAGNOSIS — R7989 Other specified abnormal findings of blood chemistry: Secondary | ICD-10-CM | POA: Diagnosis not present

## 2022-06-28 DIAGNOSIS — I129 Hypertensive chronic kidney disease with stage 1 through stage 4 chronic kidney disease, or unspecified chronic kidney disease: Secondary | ICD-10-CM | POA: Diagnosis not present

## 2022-06-28 DIAGNOSIS — G8929 Other chronic pain: Secondary | ICD-10-CM | POA: Diagnosis not present

## 2022-06-29 DIAGNOSIS — G8929 Other chronic pain: Secondary | ICD-10-CM | POA: Diagnosis not present

## 2022-06-29 DIAGNOSIS — R7989 Other specified abnormal findings of blood chemistry: Secondary | ICD-10-CM | POA: Diagnosis not present

## 2022-06-29 DIAGNOSIS — R1011 Right upper quadrant pain: Secondary | ICD-10-CM | POA: Diagnosis not present

## 2022-06-29 DIAGNOSIS — R109 Unspecified abdominal pain: Secondary | ICD-10-CM | POA: Diagnosis not present

## 2022-07-02 DIAGNOSIS — R748 Abnormal levels of other serum enzymes: Secondary | ICD-10-CM | POA: Diagnosis not present

## 2022-07-02 DIAGNOSIS — Z6825 Body mass index (BMI) 25.0-25.9, adult: Secondary | ICD-10-CM | POA: Diagnosis not present

## 2022-07-02 DIAGNOSIS — E1122 Type 2 diabetes mellitus with diabetic chronic kidney disease: Secondary | ICD-10-CM | POA: Diagnosis not present

## 2022-07-12 DIAGNOSIS — K859 Acute pancreatitis without necrosis or infection, unspecified: Secondary | ICD-10-CM | POA: Diagnosis not present

## 2022-07-16 DIAGNOSIS — K760 Fatty (change of) liver, not elsewhere classified: Secondary | ICD-10-CM | POA: Diagnosis not present

## 2022-07-16 DIAGNOSIS — K859 Acute pancreatitis without necrosis or infection, unspecified: Secondary | ICD-10-CM | POA: Diagnosis not present

## 2022-07-25 DIAGNOSIS — R748 Abnormal levels of other serum enzymes: Secondary | ICD-10-CM | POA: Diagnosis not present

## 2022-07-25 DIAGNOSIS — E1122 Type 2 diabetes mellitus with diabetic chronic kidney disease: Secondary | ICD-10-CM | POA: Diagnosis not present

## 2022-07-25 DIAGNOSIS — Z6825 Body mass index (BMI) 25.0-25.9, adult: Secondary | ICD-10-CM | POA: Diagnosis not present

## 2022-08-14 DIAGNOSIS — I10 Essential (primary) hypertension: Secondary | ICD-10-CM | POA: Diagnosis not present

## 2022-08-14 DIAGNOSIS — E1122 Type 2 diabetes mellitus with diabetic chronic kidney disease: Secondary | ICD-10-CM | POA: Diagnosis not present

## 2022-08-14 DIAGNOSIS — Z6826 Body mass index (BMI) 26.0-26.9, adult: Secondary | ICD-10-CM | POA: Diagnosis not present

## 2022-08-14 DIAGNOSIS — N400 Enlarged prostate without lower urinary tract symptoms: Secondary | ICD-10-CM | POA: Diagnosis not present

## 2022-08-15 DIAGNOSIS — K859 Acute pancreatitis without necrosis or infection, unspecified: Secondary | ICD-10-CM | POA: Diagnosis not present

## 2022-08-16 DIAGNOSIS — K859 Acute pancreatitis without necrosis or infection, unspecified: Secondary | ICD-10-CM | POA: Diagnosis not present

## 2022-09-24 DIAGNOSIS — Z6826 Body mass index (BMI) 26.0-26.9, adult: Secondary | ICD-10-CM | POA: Diagnosis not present

## 2022-09-24 DIAGNOSIS — N183 Chronic kidney disease, stage 3 unspecified: Secondary | ICD-10-CM | POA: Diagnosis not present

## 2022-09-24 DIAGNOSIS — N410 Acute prostatitis: Secondary | ICD-10-CM | POA: Diagnosis not present

## 2022-09-30 DIAGNOSIS — N503 Cyst of epididymis: Secondary | ICD-10-CM | POA: Diagnosis not present

## 2022-09-30 DIAGNOSIS — N400 Enlarged prostate without lower urinary tract symptoms: Secondary | ICD-10-CM | POA: Diagnosis not present

## 2022-09-30 DIAGNOSIS — N3289 Other specified disorders of bladder: Secondary | ICD-10-CM | POA: Diagnosis not present

## 2022-09-30 DIAGNOSIS — N12 Tubulo-interstitial nephritis, not specified as acute or chronic: Secondary | ICD-10-CM | POA: Diagnosis not present

## 2022-09-30 DIAGNOSIS — N452 Orchitis: Secondary | ICD-10-CM | POA: Diagnosis not present

## 2022-09-30 DIAGNOSIS — N3000 Acute cystitis without hematuria: Secondary | ICD-10-CM | POA: Diagnosis not present

## 2022-09-30 DIAGNOSIS — I10 Essential (primary) hypertension: Secondary | ICD-10-CM | POA: Diagnosis not present

## 2022-09-30 DIAGNOSIS — K4090 Unilateral inguinal hernia, without obstruction or gangrene, not specified as recurrent: Secondary | ICD-10-CM | POA: Diagnosis not present

## 2022-09-30 DIAGNOSIS — N442 Benign cyst of testis: Secondary | ICD-10-CM | POA: Diagnosis not present

## 2022-09-30 DIAGNOSIS — N433 Hydrocele, unspecified: Secondary | ICD-10-CM | POA: Diagnosis not present

## 2022-09-30 DIAGNOSIS — N419 Inflammatory disease of prostate, unspecified: Secondary | ICD-10-CM | POA: Diagnosis not present

## 2022-09-30 DIAGNOSIS — N451 Epididymitis: Secondary | ICD-10-CM | POA: Diagnosis not present

## 2022-09-30 DIAGNOSIS — I861 Scrotal varices: Secondary | ICD-10-CM | POA: Diagnosis not present

## 2022-10-01 DIAGNOSIS — N400 Enlarged prostate without lower urinary tract symptoms: Secondary | ICD-10-CM | POA: Diagnosis not present

## 2022-10-01 DIAGNOSIS — N419 Inflammatory disease of prostate, unspecified: Secondary | ICD-10-CM | POA: Diagnosis not present

## 2022-10-01 DIAGNOSIS — I861 Scrotal varices: Secondary | ICD-10-CM | POA: Diagnosis not present

## 2022-10-01 DIAGNOSIS — I129 Hypertensive chronic kidney disease with stage 1 through stage 4 chronic kidney disease, or unspecified chronic kidney disease: Secondary | ICD-10-CM | POA: Diagnosis not present

## 2022-10-01 DIAGNOSIS — N1832 Chronic kidney disease, stage 3b: Secondary | ICD-10-CM | POA: Diagnosis not present

## 2022-10-01 DIAGNOSIS — N452 Orchitis: Secondary | ICD-10-CM | POA: Diagnosis not present

## 2022-10-01 DIAGNOSIS — N433 Hydrocele, unspecified: Secondary | ICD-10-CM | POA: Diagnosis not present

## 2022-10-01 DIAGNOSIS — N3000 Acute cystitis without hematuria: Secondary | ICD-10-CM | POA: Diagnosis not present

## 2022-10-01 DIAGNOSIS — N410 Acute prostatitis: Secondary | ICD-10-CM | POA: Diagnosis not present

## 2022-10-01 DIAGNOSIS — N10 Acute tubulo-interstitial nephritis: Secondary | ICD-10-CM | POA: Diagnosis not present

## 2022-10-01 DIAGNOSIS — N451 Epididymitis: Secondary | ICD-10-CM | POA: Diagnosis not present

## 2022-10-01 DIAGNOSIS — N442 Benign cyst of testis: Secondary | ICD-10-CM | POA: Diagnosis not present

## 2022-10-01 DIAGNOSIS — K4090 Unilateral inguinal hernia, without obstruction or gangrene, not specified as recurrent: Secondary | ICD-10-CM | POA: Diagnosis not present

## 2022-10-01 DIAGNOSIS — N138 Other obstructive and reflux uropathy: Secondary | ICD-10-CM | POA: Diagnosis not present

## 2022-10-01 DIAGNOSIS — N453 Epididymo-orchitis: Secondary | ICD-10-CM | POA: Diagnosis not present

## 2022-10-01 DIAGNOSIS — I10 Essential (primary) hypertension: Secondary | ICD-10-CM | POA: Diagnosis not present

## 2022-10-01 DIAGNOSIS — N503 Cyst of epididymis: Secondary | ICD-10-CM | POA: Diagnosis not present

## 2022-10-01 DIAGNOSIS — N3289 Other specified disorders of bladder: Secondary | ICD-10-CM | POA: Diagnosis not present

## 2022-10-01 DIAGNOSIS — E860 Dehydration: Secondary | ICD-10-CM | POA: Diagnosis not present

## 2022-10-01 DIAGNOSIS — N401 Enlarged prostate with lower urinary tract symptoms: Secondary | ICD-10-CM | POA: Diagnosis not present

## 2022-10-01 DIAGNOSIS — N170 Acute kidney failure with tubular necrosis: Secondary | ICD-10-CM | POA: Diagnosis not present

## 2022-10-01 DIAGNOSIS — N12 Tubulo-interstitial nephritis, not specified as acute or chronic: Secondary | ICD-10-CM | POA: Diagnosis not present

## 2022-10-07 DIAGNOSIS — R051 Acute cough: Secondary | ICD-10-CM | POA: Diagnosis not present

## 2022-10-07 DIAGNOSIS — J349 Unspecified disorder of nose and nasal sinuses: Secondary | ICD-10-CM | POA: Diagnosis not present

## 2022-10-15 DIAGNOSIS — Z6826 Body mass index (BMI) 26.0-26.9, adult: Secondary | ICD-10-CM | POA: Diagnosis not present

## 2022-10-15 DIAGNOSIS — N453 Epididymo-orchitis: Secondary | ICD-10-CM | POA: Diagnosis not present

## 2022-10-15 DIAGNOSIS — N400 Enlarged prostate without lower urinary tract symptoms: Secondary | ICD-10-CM | POA: Diagnosis not present

## 2022-10-23 DIAGNOSIS — N451 Epididymitis: Secondary | ICD-10-CM | POA: Diagnosis not present

## 2022-10-23 DIAGNOSIS — Z79899 Other long term (current) drug therapy: Secondary | ICD-10-CM | POA: Diagnosis not present

## 2022-10-23 DIAGNOSIS — Z125 Encounter for screening for malignant neoplasm of prostate: Secondary | ICD-10-CM | POA: Diagnosis not present

## 2022-10-23 DIAGNOSIS — R351 Nocturia: Secondary | ICD-10-CM | POA: Diagnosis not present

## 2022-10-23 DIAGNOSIS — N401 Enlarged prostate with lower urinary tract symptoms: Secondary | ICD-10-CM | POA: Diagnosis not present

## 2022-10-26 DIAGNOSIS — K589 Irritable bowel syndrome without diarrhea: Secondary | ICD-10-CM | POA: Diagnosis not present

## 2022-10-26 DIAGNOSIS — Z6826 Body mass index (BMI) 26.0-26.9, adult: Secondary | ICD-10-CM | POA: Diagnosis not present

## 2022-11-15 DIAGNOSIS — Z125 Encounter for screening for malignant neoplasm of prostate: Secondary | ICD-10-CM | POA: Diagnosis not present

## 2022-11-15 DIAGNOSIS — E7800 Pure hypercholesterolemia, unspecified: Secondary | ICD-10-CM | POA: Diagnosis not present

## 2022-11-15 DIAGNOSIS — Z0000 Encounter for general adult medical examination without abnormal findings: Secondary | ICD-10-CM | POA: Diagnosis not present

## 2022-11-15 DIAGNOSIS — E1122 Type 2 diabetes mellitus with diabetic chronic kidney disease: Secondary | ICD-10-CM | POA: Diagnosis not present

## 2022-11-15 DIAGNOSIS — Z6826 Body mass index (BMI) 26.0-26.9, adult: Secondary | ICD-10-CM | POA: Diagnosis not present

## 2022-11-15 DIAGNOSIS — Z79899 Other long term (current) drug therapy: Secondary | ICD-10-CM | POA: Diagnosis not present

## 2022-11-15 DIAGNOSIS — N183 Chronic kidney disease, stage 3 unspecified: Secondary | ICD-10-CM | POA: Diagnosis not present

## 2022-11-15 DIAGNOSIS — D649 Anemia, unspecified: Secondary | ICD-10-CM | POA: Diagnosis not present

## 2022-11-28 DIAGNOSIS — R351 Nocturia: Secondary | ICD-10-CM | POA: Diagnosis not present

## 2022-11-28 DIAGNOSIS — N451 Epididymitis: Secondary | ICD-10-CM | POA: Diagnosis not present

## 2022-11-28 DIAGNOSIS — N401 Enlarged prostate with lower urinary tract symptoms: Secondary | ICD-10-CM | POA: Diagnosis not present

## 2022-11-28 DIAGNOSIS — Z8042 Family history of malignant neoplasm of prostate: Secondary | ICD-10-CM | POA: Diagnosis not present

## 2023-01-02 DIAGNOSIS — K859 Acute pancreatitis without necrosis or infection, unspecified: Secondary | ICD-10-CM | POA: Diagnosis not present

## 2023-01-22 DIAGNOSIS — H524 Presbyopia: Secondary | ICD-10-CM | POA: Diagnosis not present

## 2023-01-22 DIAGNOSIS — E119 Type 2 diabetes mellitus without complications: Secondary | ICD-10-CM | POA: Diagnosis not present

## 2023-01-22 DIAGNOSIS — Z961 Presence of intraocular lens: Secondary | ICD-10-CM | POA: Diagnosis not present

## 2023-06-03 IMAGING — DX DG CHEST 2V
2 series · 2 of 2 positions shown · non-contrast
Comparison: CT chest 02/23/2015

CLINICAL DATA: 80-year-old male with a history of dyspnea

EXAM:
CHEST - 2 VIEW

[chest pa]
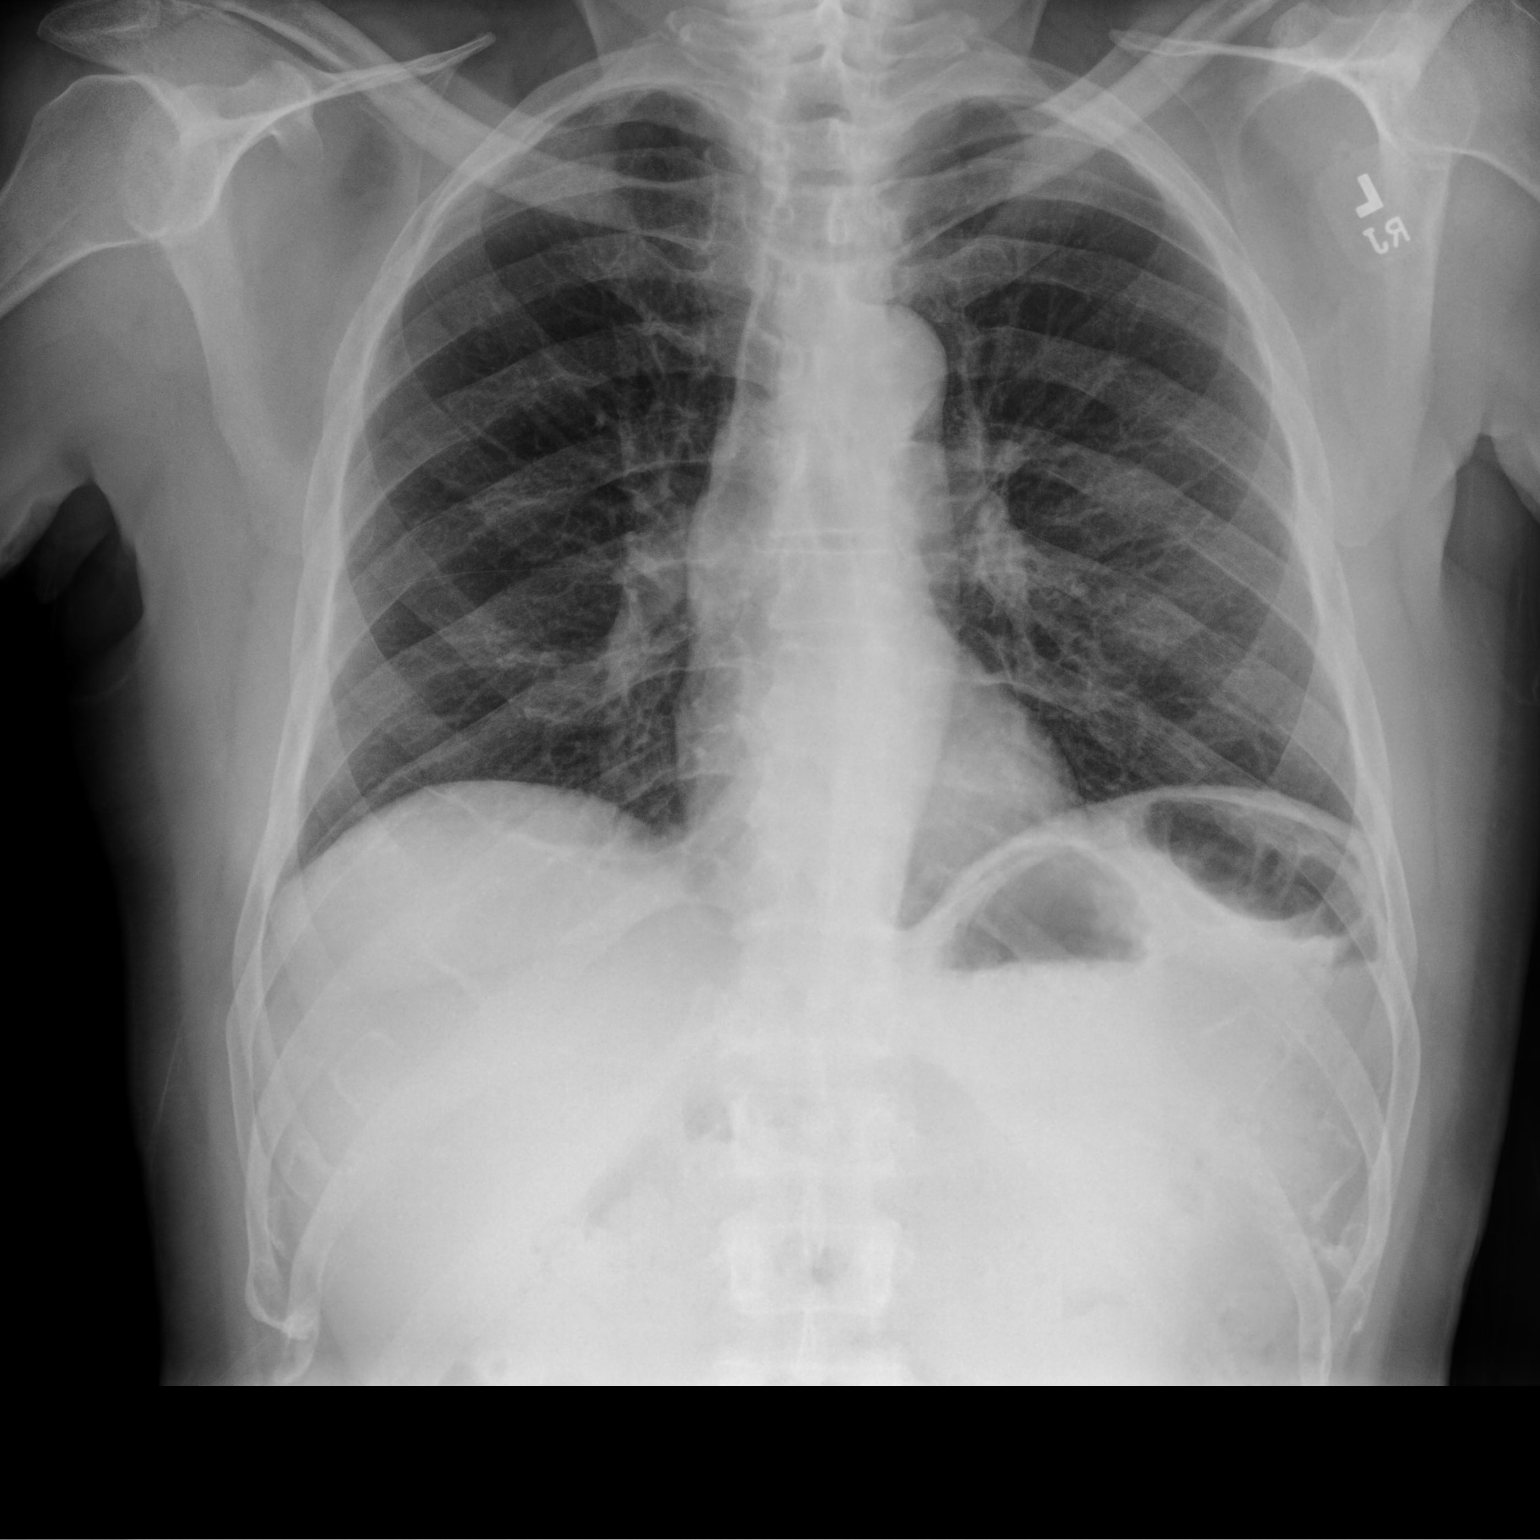

[chest lat]
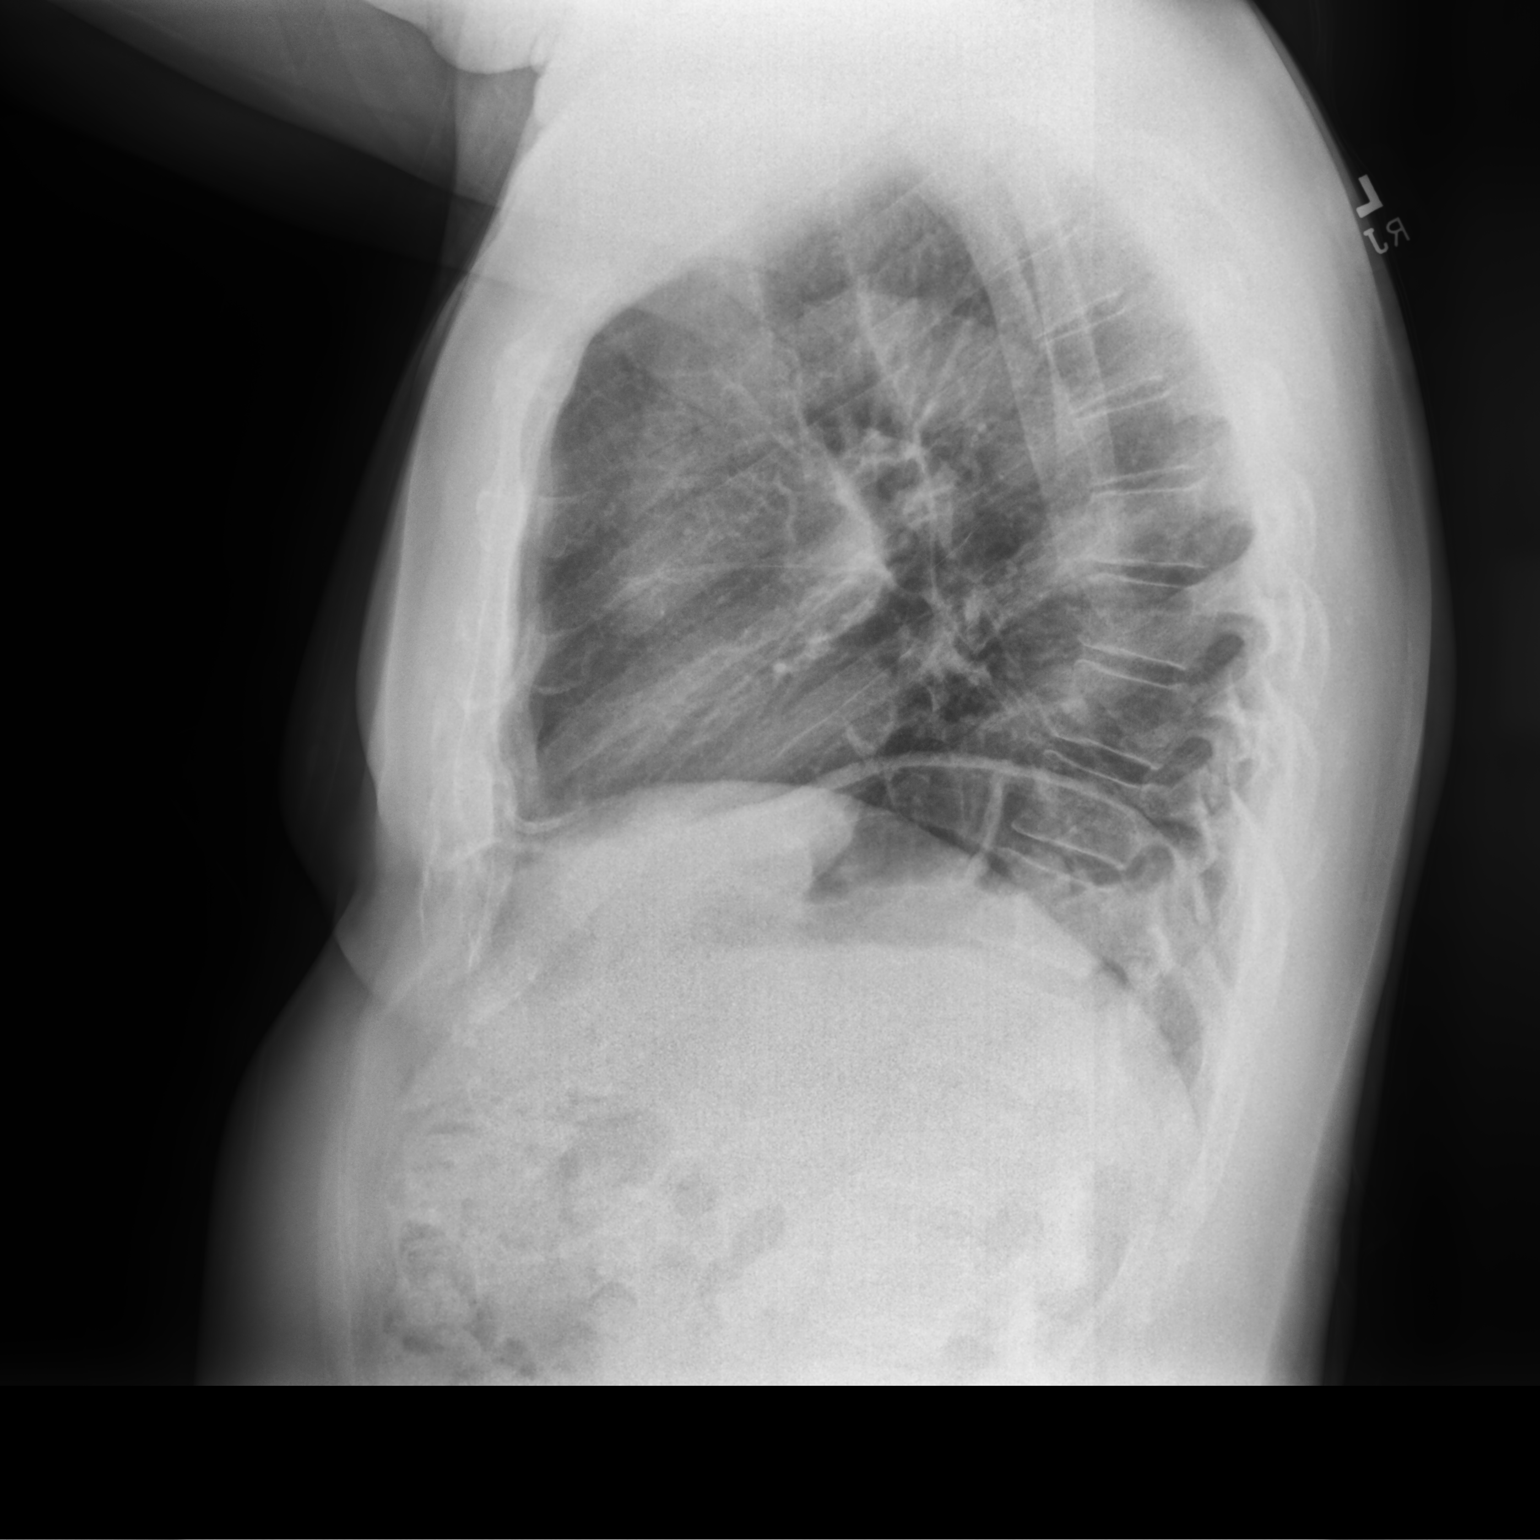

[2 of 2 positions shown; findings below may reference images not displayed]

FINDINGS: Cardiomediastinal silhouette within normal limits in size and
contour. No evidence of central vascular congestion. No interlobular
septal thickening.

No pneumothorax or pleural effusion. Coarsened interstitial
markings, with no confluent airspace disease.

No acute displaced fracture. Degenerative changes of the spine.
IMPRESSION: No active cardiopulmonary disease.

## 2023-06-08 DIAGNOSIS — R300 Dysuria: Secondary | ICD-10-CM | POA: Diagnosis not present

## 2023-06-08 DIAGNOSIS — R070 Pain in throat: Secondary | ICD-10-CM | POA: Diagnosis not present

## 2023-06-08 DIAGNOSIS — J324 Chronic pansinusitis: Secondary | ICD-10-CM | POA: Diagnosis not present

## 2023-06-08 DIAGNOSIS — R319 Hematuria, unspecified: Secondary | ICD-10-CM | POA: Diagnosis not present

## 2023-06-26 DIAGNOSIS — Z8042 Family history of malignant neoplasm of prostate: Secondary | ICD-10-CM | POA: Diagnosis not present

## 2023-06-26 DIAGNOSIS — N401 Enlarged prostate with lower urinary tract symptoms: Secondary | ICD-10-CM | POA: Diagnosis not present

## 2023-06-26 DIAGNOSIS — R351 Nocturia: Secondary | ICD-10-CM | POA: Diagnosis not present

## 2023-11-04 DIAGNOSIS — R6889 Other general symptoms and signs: Secondary | ICD-10-CM | POA: Diagnosis not present

## 2023-11-04 DIAGNOSIS — Z6826 Body mass index (BMI) 26.0-26.9, adult: Secondary | ICD-10-CM | POA: Diagnosis not present

## 2023-11-05 DIAGNOSIS — R6889 Other general symptoms and signs: Secondary | ICD-10-CM | POA: Diagnosis not present

## 2023-11-27 DIAGNOSIS — J449 Chronic obstructive pulmonary disease, unspecified: Secondary | ICD-10-CM | POA: Diagnosis not present

## 2023-11-27 DIAGNOSIS — N183 Chronic kidney disease, stage 3 unspecified: Secondary | ICD-10-CM | POA: Diagnosis not present

## 2023-11-27 DIAGNOSIS — Z9181 History of falling: Secondary | ICD-10-CM | POA: Diagnosis not present

## 2023-11-27 DIAGNOSIS — L290 Pruritus ani: Secondary | ICD-10-CM | POA: Diagnosis not present

## 2023-11-27 DIAGNOSIS — Z0000 Encounter for general adult medical examination without abnormal findings: Secondary | ICD-10-CM | POA: Diagnosis not present

## 2023-11-27 DIAGNOSIS — Z1331 Encounter for screening for depression: Secondary | ICD-10-CM | POA: Diagnosis not present

## 2023-11-27 DIAGNOSIS — E1122 Type 2 diabetes mellitus with diabetic chronic kidney disease: Secondary | ICD-10-CM | POA: Diagnosis not present

## 2023-11-27 DIAGNOSIS — Z1339 Encounter for screening examination for other mental health and behavioral disorders: Secondary | ICD-10-CM | POA: Diagnosis not present

## 2023-11-27 DIAGNOSIS — Z6826 Body mass index (BMI) 26.0-26.9, adult: Secondary | ICD-10-CM | POA: Diagnosis not present

## 2023-12-02 DIAGNOSIS — Z125 Encounter for screening for malignant neoplasm of prostate: Secondary | ICD-10-CM | POA: Diagnosis not present

## 2023-12-02 DIAGNOSIS — E1165 Type 2 diabetes mellitus with hyperglycemia: Secondary | ICD-10-CM | POA: Diagnosis not present

## 2023-12-02 DIAGNOSIS — E7800 Pure hypercholesterolemia, unspecified: Secondary | ICD-10-CM | POA: Diagnosis not present

## 2023-12-02 DIAGNOSIS — Z79899 Other long term (current) drug therapy: Secondary | ICD-10-CM | POA: Diagnosis not present

## 2023-12-10 DIAGNOSIS — E1165 Type 2 diabetes mellitus with hyperglycemia: Secondary | ICD-10-CM | POA: Diagnosis not present

## 2023-12-10 DIAGNOSIS — Z6826 Body mass index (BMI) 26.0-26.9, adult: Secondary | ICD-10-CM | POA: Diagnosis not present

## 2023-12-25 DIAGNOSIS — R351 Nocturia: Secondary | ICD-10-CM | POA: Diagnosis not present

## 2023-12-25 DIAGNOSIS — N401 Enlarged prostate with lower urinary tract symptoms: Secondary | ICD-10-CM | POA: Diagnosis not present

## 2023-12-25 DIAGNOSIS — Z8042 Family history of malignant neoplasm of prostate: Secondary | ICD-10-CM | POA: Diagnosis not present

## 2023-12-26 DIAGNOSIS — N401 Enlarged prostate with lower urinary tract symptoms: Secondary | ICD-10-CM | POA: Diagnosis not present

## 2024-01-04 DIAGNOSIS — R051 Acute cough: Secondary | ICD-10-CM | POA: Diagnosis not present

## 2024-01-04 DIAGNOSIS — R519 Headache, unspecified: Secondary | ICD-10-CM | POA: Diagnosis not present

## 2024-01-04 DIAGNOSIS — R0981 Nasal congestion: Secondary | ICD-10-CM | POA: Diagnosis not present

## 2024-01-04 DIAGNOSIS — R509 Fever, unspecified: Secondary | ICD-10-CM | POA: Diagnosis not present

## 2024-01-04 DIAGNOSIS — R350 Frequency of micturition: Secondary | ICD-10-CM | POA: Diagnosis not present

## 2024-01-28 DIAGNOSIS — E119 Type 2 diabetes mellitus without complications: Secondary | ICD-10-CM | POA: Diagnosis not present

## 2024-05-28 DIAGNOSIS — N183 Chronic kidney disease, stage 3 unspecified: Secondary | ICD-10-CM | POA: Diagnosis not present

## 2024-05-28 DIAGNOSIS — E1122 Type 2 diabetes mellitus with diabetic chronic kidney disease: Secondary | ICD-10-CM | POA: Diagnosis not present

## 2024-05-28 DIAGNOSIS — L304 Erythema intertrigo: Secondary | ICD-10-CM | POA: Diagnosis not present

## 2024-05-28 DIAGNOSIS — Z6825 Body mass index (BMI) 25.0-25.9, adult: Secondary | ICD-10-CM | POA: Diagnosis not present

## 2024-06-26 DIAGNOSIS — N401 Enlarged prostate with lower urinary tract symptoms: Secondary | ICD-10-CM | POA: Diagnosis not present

## 2024-06-26 DIAGNOSIS — R351 Nocturia: Secondary | ICD-10-CM | POA: Diagnosis not present

## 2024-06-26 DIAGNOSIS — Z8042 Family history of malignant neoplasm of prostate: Secondary | ICD-10-CM | POA: Diagnosis not present

## 2024-07-02 DIAGNOSIS — K648 Other hemorrhoids: Secondary | ICD-10-CM | POA: Diagnosis not present

## 2024-07-02 DIAGNOSIS — N481 Balanitis: Secondary | ICD-10-CM | POA: Diagnosis not present

## 2024-07-02 DIAGNOSIS — Z6825 Body mass index (BMI) 25.0-25.9, adult: Secondary | ICD-10-CM | POA: Diagnosis not present

## 2024-08-07 DIAGNOSIS — E1122 Type 2 diabetes mellitus with diabetic chronic kidney disease: Secondary | ICD-10-CM | POA: Diagnosis not present

## 2024-08-07 DIAGNOSIS — Z809 Family history of malignant neoplasm, unspecified: Secondary | ICD-10-CM | POA: Diagnosis not present

## 2024-08-07 DIAGNOSIS — K219 Gastro-esophageal reflux disease without esophagitis: Secondary | ICD-10-CM | POA: Diagnosis not present

## 2024-08-07 DIAGNOSIS — I129 Hypertensive chronic kidney disease with stage 1 through stage 4 chronic kidney disease, or unspecified chronic kidney disease: Secondary | ICD-10-CM | POA: Diagnosis not present

## 2024-08-07 DIAGNOSIS — J439 Emphysema, unspecified: Secondary | ICD-10-CM | POA: Diagnosis not present

## 2024-08-07 DIAGNOSIS — Z87891 Personal history of nicotine dependence: Secondary | ICD-10-CM | POA: Diagnosis not present

## 2024-08-07 DIAGNOSIS — N189 Chronic kidney disease, unspecified: Secondary | ICD-10-CM | POA: Diagnosis not present

## 2024-08-07 DIAGNOSIS — E785 Hyperlipidemia, unspecified: Secondary | ICD-10-CM | POA: Diagnosis not present

## 2024-08-07 DIAGNOSIS — Z794 Long term (current) use of insulin: Secondary | ICD-10-CM | POA: Diagnosis not present
